# Patient Record
Sex: Female | Born: 1995 | Race: White | Hispanic: No | Marital: Married | Smoking: Never smoker
Health system: Southern US, Community
[De-identification: ages and names within clinical notes are randomized; demographics above are authoritative.]

---
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
## 2015-01-26 ENCOUNTER — Emergency Department: Admit: 2015-01-26 | Disposition: A | Discharge status: Home / Self Care | Payer: Self-pay | Admitting: Internal Medicine

## 2015-12-26 ENCOUNTER — Emergency Department: Payer: Self-pay

## 2015-12-26 ENCOUNTER — Emergency Department
Admission: EM | Admit: 2015-12-26 | Discharge: 2015-12-26 | Disposition: A | Discharge status: Home / Self Care | Payer: Self-pay | Attending: Emergency Medicine | Admitting: Emergency Medicine

## 2015-12-26 ENCOUNTER — Encounter: Payer: Self-pay | Admitting: Emergency Medicine

## 2015-12-26 DIAGNOSIS — W228XXA Striking against or struck by other objects, initial encounter: Secondary | ICD-10-CM | POA: Insufficient documentation

## 2015-12-26 DIAGNOSIS — S8011XA Contusion of right lower leg, initial encounter: Secondary | ICD-10-CM | POA: Insufficient documentation

## 2015-12-26 DIAGNOSIS — Y998 Other external cause status: Secondary | ICD-10-CM | POA: Insufficient documentation

## 2015-12-26 DIAGNOSIS — Y9289 Other specified places as the place of occurrence of the external cause: Secondary | ICD-10-CM | POA: Insufficient documentation

## 2015-12-26 DIAGNOSIS — Y9389 Activity, other specified: Secondary | ICD-10-CM | POA: Insufficient documentation

## 2015-12-26 MED ORDER — MELOXICAM 15 MG PO TABS: 15.0000 mg | ORAL_TABLET | Freq: Every day | ORAL | Status: AC

## 2015-12-26 NOTE — Discharge Instructions (Signed)
Periosteal Hematoma °Periosteal hematoma (bone bruise) is a localized, tender, raised area close to the bone. It can occur from a small hidden fracture of the bone, following surgery, or from other trauma to the area. It typically occurs in bones located close to the surface of the skin, such as the shin, knee, and heel bone. Although it may take 2 or more weeks to completely heal, bone bruises typically are not associated with permanent or serious damage to the bone. If you are taking blood thinners, you may be at greater risk for such injuries.  °CAUSES  °A bone bruise is usually caused by high-impact trauma to the bone, but it can be caused by sports injuries or twisting injuries. °SIGNS AND SYMPTOMS  °· Severe pain around the injured area that typically lasts longer than a normal bruise. °· Difficulty using the bruised area. °· Tender, raised area close to the bone. °· Discoloration or swelling of the bruised area. °DIAGNOSIS  °You may need an MRI of the injured area to confirm a bone bruise if your health care provider feels it is necessary. A regular X-ray will not detect a bone bruise, but it will detect a broken bone (fracture). An X-ray may be taken to rule out any fractures. °TREATMENT  °Often, the best treatment for a bone bruise is resting, icing, and applying cold compresses to the injured area. Over-the-counter medicines may also be recommended for pain control. °HOME CARE INSTRUCTIONS  °Some things you can do to improve the condition are:  °· Rest and elevate the area of injury as long as it is very tender or swollen. °· Apply ice to the injured area: °· Put ice in a plastic bag. °· Place a towel between your skin and the bag. °· Leave the ice on for 20 minutes, 2-3 times a day. °· Use an elastic wrap to reduce swelling and protect the injured area. Make sure it is not applied too tightly. If the area around the wrap becomes cold or blue, the wrap is too tight. Wrap it more loosely. °· For  activity: °· Follow your health care provider's instructions about whether walking with crutches is required. This will depend on how serious your condition is. °· Start weight bearing gradually on the bruised part. °· Continue to use crutches or a cane until you can stand without causing pain, or as instructed. °· If a plaster splint was applied: °· Wear the splint until you are seen for a follow-up exam. °· Rest it on nothing harder than a pillow the first 24 hours. °· Do not put weight on it. °· Do not get it wet. You may take it off to take a shower or bath. °· You may have been given an elastic bandage to use with or without the plaster splint. The splint is too tight if you have numbness or tingling, or if the skin around the bandage becomes cold and blue. Adjust the bandage to make it comfortable. °· If an air splint was applied: °· You may alter the amount of air in the splint as needed for comfort. °· You may take it off at night and to take a shower or bath. °· If the injury was in either leg, wiggle your toes in the splint several times per day if you are able. °· Only take over-the-counter or prescription medicines for pain, discomfort, or fever as directed by your health care provider. °· Keep all follow-up visits with your health care provider. This includes   any orthopedic referrals, physical therapy, and rehabilitation. Any delay in getting necessary care could result in a delay or failure of the bones to heal. °SEEK MEDICAL CARE IF:  °· You have an increase in bruising, swelling, tenderness, heat, or pain over your injury. °· You notice coldness of your toes that does not improve after removing a splint or bandage. °· Your pain is not lessened after you take medicine. °· You have increased difficulty bearing weight on the injured leg, if the injury is in either leg. °SEEK IMMEDIATE MEDICAL CARE IF:  °· You have severe pain near the injured area or severe pain with stretching. °· You have increased  swelling that resulted in a tense, hard area or a loss of sensation in the area of the injury. °· You have pale, cool skin below the area of the injury (in an extremity) that does not go away after removing a splint or bandage. °MAKE SURE YOU:  °· Understand these instructions. °· Will watch your condition. °· Will get help right away if you are not doing well or get worse. °  °This information is not intended to replace advice given to you by your health care provider. Make sure you discuss any questions you have with your health care provider. °  °Document Released: 11/19/2004 Document Revised: 08/02/2013 Document Reviewed: 03/31/2013 °Elsevier Interactive Patient Education ©2016 [employer] ° °Cryotherapy °Cryotherapy means treatment with cold. Ice or gel packs can be used to reduce both pain and swelling. Ice is the most helpful within the first 24 to 48 hours after an injury or flare-up from overusing a muscle or joint. Sprains, strains, spasms, burning pain, shooting pain, and aches can all be eased with ice. Ice can also be used when recovering from surgery. Ice is effective, has very few side effects, and is safe for most people to use. °PRECAUTIONS  °Ice is not a safe treatment option for people with: °· Raynaud phenomenon. This is a condition affecting small blood vessels in the extremities. Exposure to cold may cause your problems to return. °· Cold hypersensitivity. There are many forms of cold hypersensitivity, including: °¨ Cold urticaria. Red, itchy hives appear on the skin when the tissues begin to warm after being iced. °¨ Cold erythema. This is a red, itchy rash caused by exposure to cold. °¨ Cold hemoglobinuria. Red blood cells break down when the tissues begin to warm after being iced. The hemoglobin that carry oxygen are passed into the urine because they cannot combine with blood proteins fast enough. °· Numbness or altered sensitivity in the area being iced. °If you have any of the following  conditions, do not use ice until you have discussed cryotherapy with your caregiver: °· Heart conditions, such as arrhythmia, angina, or chronic heart disease. °· High blood pressure. °· Healing wounds or open skin in the area being iced. °· Current infections. °· Rheumatoid arthritis. °· Poor circulation. °· Diabetes. °Ice slows the blood flow in the region it is applied. This is beneficial when trying to stop inflamed tissues from spreading irritating chemicals to surrounding tissues. However, if you expose your skin to cold temperatures for too long or without the proper protection, you can damage your skin or nerves. Watch for signs of skin damage due to cold. °HOME CARE INSTRUCTIONS °Follow these tips to use ice and cold packs safely. °· Place a dry or damp towel between the ice and skin. A damp towel will cool the skin more quickly, so you may   need to shorten the time that the ice is used. °· For a more rapid response, add gentle compression to the ice. °· Ice for no more than 10 to 20 minutes at a time. The bonier the area you are icing, the less time it will take to get the benefits of ice. °· Check your skin after 5 minutes to make sure there are no signs of a poor response to cold or skin damage. °· Rest 20 minutes or more between uses. °· Once your skin is numb, you can end your treatment. You can test numbness by very lightly touching your skin. The touch should be so light that you do not see the skin dimple from the pressure of your fingertip. When using ice, most people will feel these normal sensations in this order: cold, burning, aching, and numbness. °· Do not use ice on someone who cannot communicate their responses to pain, such as small children or people with dementia. °HOW TO MAKE AN ICE PACK °Ice packs are the most common way to use ice therapy. Other methods include ice massage, ice baths, and cryosprays. Muscle creams that cause a cold, tingly feeling do not offer the same benefits that  ice offers and should not be used as a substitute unless recommended by your caregiver. °To make an ice pack, do one of the following: °· Place crushed ice or a bag of frozen vegetables in a sealable plastic bag. Squeeze out the excess air. Place this bag inside another plastic bag. Slide the bag into a pillowcase or place a damp towel between your skin and the bag. °· Mix 3 parts water with 1 part rubbing alcohol. Freeze the mixture in a sealable plastic bag. When you remove the mixture from the freezer, it will be slushy. Squeeze out the excess air. Place this bag inside another plastic bag. Slide the bag into a pillowcase or place a damp towel between your skin and the bag. °SEEK MEDICAL CARE IF: °· You develop white spots on your skin. This may give the skin a blotchy (mottled) appearance. °· Your skin turns blue or pale. °· Your skin becomes waxy or hard. °· Your swelling gets worse. °MAKE SURE YOU:  °· Understand these instructions. °· Will watch your condition. °· Will get help right away if you are not doing well or get worse. °  °This information is not intended to replace advice given to you by your health care provider. Make sure you discuss any questions you have with your health care provider. °  °Document Released: 06/08/2011 Document Revised: 11/02/2014 Document Reviewed: 06/08/2011 °Elsevier Interactive Patient Education ©2016 [employer] ° °

## 2015-12-26 NOTE — ED Notes (Signed)
Pt a/o. Right lower leg with bruising that occurred Sunday nite. + pulses. Sensation intact. Full rom.

## 2015-12-26 NOTE — ED Notes (Signed)
Pt was playing a night game, ran into a muffler sticking out of the back of a car with right leg, pain in ankle and lower shin. Walked to triage.

## 2015-12-26 NOTE — ED Provider Notes (Signed)
[facility] Emergency Department Provider Note  ____________________________________________  Time seen: Approximately 7:07 PM  I have reviewed the triage vital signs and the nursing notes.   HISTORY  Chief Complaint Leg Pain and Ankle Pain    HPI ONIYAH ROHE is a 20 y.o. female who presents to emergency department complaining of lower right shin pain. Patient states that 4 days prior she accidentally kicked the tongue of a trailer. She has had ecchymosis and swelling to the distal tibia. She states that she is able to walk and then the pain is minimal however the swelling and ecchymosis has continued. Patient denies any numbness or tingling in the distal foot. Patient denies any pain in the ankle or foot.   History reviewed. No pertinent past medical history.  There are no active problems to display for this patient.   History reviewed. No pertinent past surgical history.  Current Outpatient Rx  Name  Route  Sig  Dispense  Refill  . meloxicam (MOBIC) 15 MG tablet   Oral   Take 1 tablet (15 mg total) by mouth daily.   30 tablet   0     Allergies Review of patient's allergies indicates no known allergies.  No family history on file.  Social History Social History  Substance Use Topics  . Smoking status: Never Smoker   . Smokeless tobacco: None  . Alcohol Use: No     Review of Systems  Constitutional: No fever/chills Musculoskeletal: Negative for back pain. Positive for right lower leg pain. Skin: Negative for rash. Neurological: Negative for headaches, focal weakness or numbness. 10-point ROS otherwise negative.  ____________________________________________   PHYSICAL EXAM:  VITAL SIGNS: ED Triage Vitals  Enc Vitals Group     BP 12/26/15 1755 141/57 mmHg     Pulse Rate 12/26/15 1755 77     Resp 12/26/15 1755 18     Temp 12/26/15 1755 97.6 F (36.4 C)     Temp Source 12/26/15 1755 Oral     SpO2 12/26/15 1755 100 %      Weight --      Height --      Head Cir --      Peak Flow --      Pain Score 12/26/15 1752 1     Pain Loc --      Pain Edu? --      Excl. in GC? --      Constitutional: Alert and oriented. Well appearing and in no acute distress. Eyes: Conjunctivae are normal. PERRL. EOMI. Head: Atraumatic. Cardiovascular: Normal rate, regular rhythm. Normal S1 and S2.  Good peripheral circulation. Respiratory: Normal respiratory effort without tachypnea or retractions. Lungs CTAB. Musculoskeletal: Edema and ecchymosis noted to the distal right tibia. Patient is diffusely tender to palpation over the distal tibia. No point tenderness. No palpable abnormality. Full range of motion to ankle. Patient is nontender to palpation over either malleolus. Dorsalis pedis pulses appreciated. Sensation intact and equal to unaffected extremity. Neurologic:  Normal speech and language. No gross focal neurologic deficits are appreciated.  Skin:  Skin is warm, dry and intact. No rash noted. Psychiatric: Mood and affect are normal. Speech and behavior are normal. Patient exhibits appropriate insight and judgement.   ____________________________________________   LABS (all labs ordered are listed, but only abnormal results are displayed)  Labs Reviewed - No data to display ____________________________________________  EKG   ____________________________________________  RADIOLOGY Festus Barren Madylyn Insco, personally viewed and evaluated these images (plain radiographs)  as part of my medical decision making, as well as reviewing the written report by the radiologist.  Dg Tibia/fibula Right  12/26/2015  CLINICAL DATA:  Ankle pain for 4 days following running in to car, initial encounter EXAM: RIGHT TIBIA AND FIBULA - 2 VIEW COMPARISON:  None. FINDINGS: There is no evidence of fracture or other focal bone lesions. Soft tissues are unremarkable. IMPRESSION: No acute abnormality noted. Electronically Signed   By:  Alcide Clever M.D.   On: 12/26/2015 19:43    ____________________________________________    PROCEDURES  Procedure(s) performed:       Medications - No data to display   ____________________________________________   INITIAL IMPRESSION / ASSESSMENT AND PLAN / ED COURSE  Pertinent labs & imaging results that were available during my care of the patient were reviewed by me and considered in my medical decision making (see chart for details).  Patient's diagnosis is consistent with tibial contusion. X-ray reveals no acute osseous abnormality. Patient states that she is able to ambulate without assistance and declined use of crutches at this time.. Patient will be discharged home with prescriptions for anti-inflammatories for symptom control. Patient is to follow up with orthopedics if symptoms persist past this treatment course. Patient is given ED precautions to return to the ED for any worsening or new symptoms.     ____________________________________________  FINAL CLINICAL IMPRESSION(S) / ED DIAGNOSES  Final diagnoses:  Contusion of leg, right, initial encounter      NEW MEDICATIONS STARTED DURING THIS VISIT:  New Prescriptions   MELOXICAM (MOBIC) 15 MG TABLET    Take 1 tablet (15 mg total) by mouth daily.        Delorise Royals Laterrian Hevener, PA-C 12/26/15 2038  Loleta Rose, MD 12/27/15 [phone]

## 2016-03-28 IMAGING — CR DG TIBIA/FIBULA 2V*R*
1 series · 2 of 2 positions shown · non-contrast
Comparison: None.

CLINICAL DATA: Ankle pain for 4 days following running in to car,
initial encounter

EXAM:
RIGHT TIBIA AND FIBULA - 2 VIEW

[Series 1: x tib-fib ap right · 0.14mm/px · 2 of 2 slices shown]
[im 1/2]
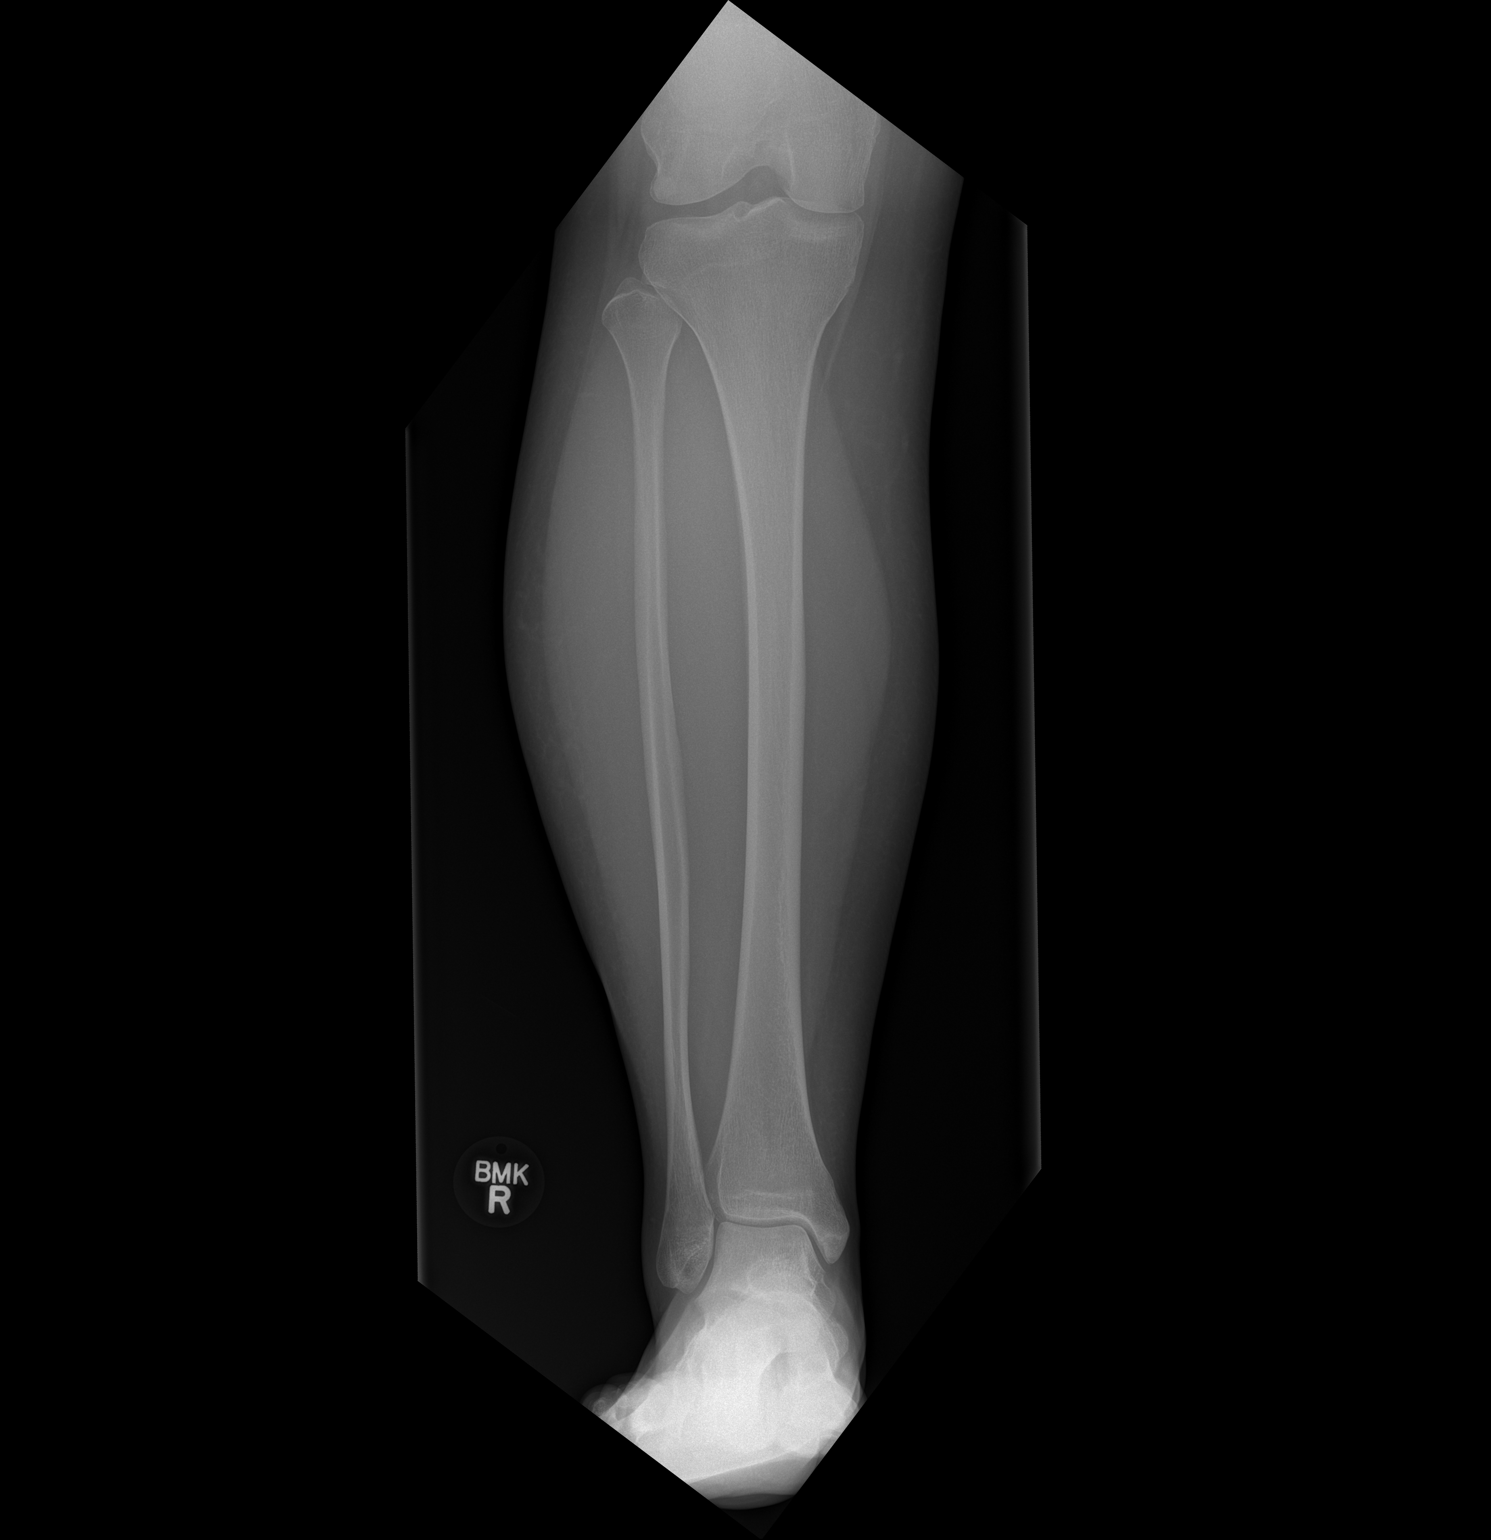
[im 2/2]
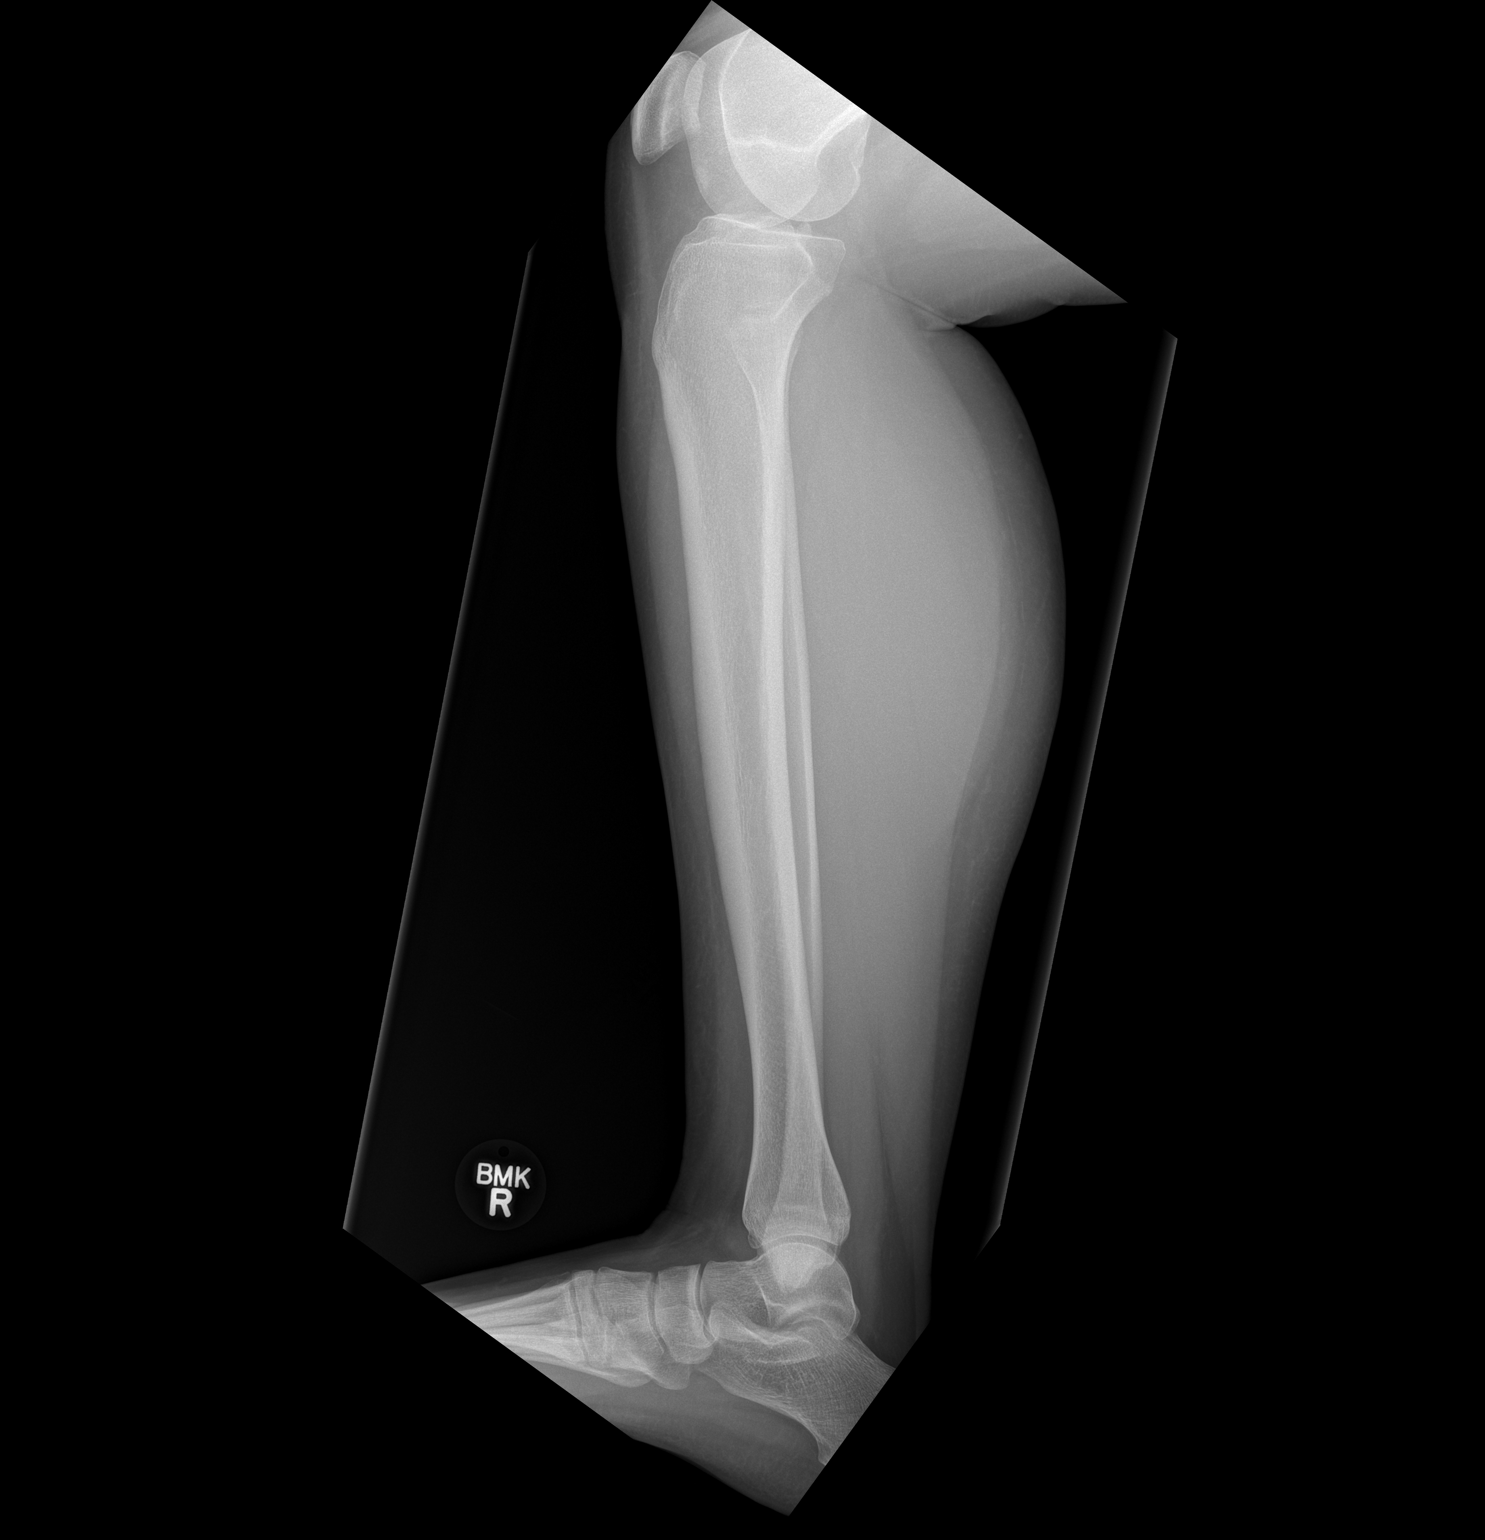

[2 of 2 positions shown; findings below may reference images not displayed]

FINDINGS: There is no evidence of fracture or other focal bone lesions. Soft
tissues are unremarkable.
IMPRESSION: No acute abnormality noted.

## 2018-06-08 ENCOUNTER — Encounter: Payer: Self-pay | Admitting: Intensive Care

## 2018-06-08 ENCOUNTER — Other Ambulatory Visit: Payer: Self-pay

## 2018-06-08 ENCOUNTER — Emergency Department: Payer: BC Managed Care – PPO

## 2018-06-08 ENCOUNTER — Emergency Department
Admission: EM | Admit: 2018-06-08 | Discharge: 2018-06-08 | Disposition: A | Discharge status: Home / Self Care | Payer: BC Managed Care – PPO | Attending: Emergency Medicine | Admitting: Emergency Medicine

## 2018-06-08 DIAGNOSIS — R109 Unspecified abdominal pain: Secondary | ICD-10-CM | POA: Diagnosis not present

## 2018-06-08 DIAGNOSIS — O209 Hemorrhage in early pregnancy, unspecified: Secondary | ICD-10-CM | POA: Diagnosis not present

## 2018-06-08 DIAGNOSIS — Z3201 Encounter for pregnancy test, result positive: Secondary | ICD-10-CM | POA: Diagnosis not present

## 2018-06-08 DIAGNOSIS — O26899 Other specified pregnancy related conditions, unspecified trimester: Secondary | ICD-10-CM | POA: Diagnosis not present

## 2018-06-08 DIAGNOSIS — O4690 Antepartum hemorrhage, unspecified, unspecified trimester: Secondary | ICD-10-CM

## 2018-06-08 DIAGNOSIS — Z3A00 Weeks of gestation of pregnancy not specified: Secondary | ICD-10-CM | POA: Diagnosis not present

## 2018-06-08 LAB — CBC
HCT: 41.5 % (ref 35.0–47.0)
Hemoglobin: 14.2 g/dL (ref 12.0–16.0)
MCH: 31.6 pg (ref 26.0–34.0)
MCHC: 34.1 g/dL (ref 32.0–36.0)
MCV: 92.6 fL (ref 80.0–100.0)
PLATELETS: 261 10*3/uL (ref 150–440)
RBC: 4.48 MIL/uL (ref 3.80–5.20)
RDW: 13.8 % (ref 11.5–14.5)
WBC: 9.7 10*3/uL (ref 3.6–11.0)

## 2018-06-08 LAB — ABO/RH: ABO/RH(D): A POS

## 2018-06-08 LAB — POCT PREGNANCY, URINE: PREG TEST UR: POSITIVE — AB

## 2018-06-08 LAB — HCG, QUANTITATIVE, PREGNANCY: HCG, BETA CHAIN, QUANT, S: 465 m[IU]/mL — AB (ref <5)

## 2018-06-08 NOTE — Discharge Instructions (Addendum)
As I explained to you, the ultrasound is concerning for possible pregnancy located in the left ovary.  This can make you very sick and it is very important they follow-up with OB/GYN on Friday for repeat pregnancy hormones.  If you develop worsening abdominal pain, if you feel dizzy like you are going to pass out, or if you have heavy vaginal bleeding please return to the emergency room.

## 2018-06-08 NOTE — ED Notes (Signed)
No other orders at this time per Roxan Hockeyobinson

## 2018-06-08 NOTE — ED Triage Notes (Addendum)
Patient reports vaginal bleeding since last Monday 05/30/18. Recently had positive pregnancy test beginning of August. Last menstrual cycle June 26th. Intermittent abdominal cramping

## 2018-06-08 NOTE — ED Notes (Signed)
FIRST NURSE NOTE:  Pt briefly told registration she has been having vaginal bleeding for several days and is having a miscarriage.

## 2018-06-08 NOTE — ED Provider Notes (Signed)
[facility] Emergency Department Provider Note  ____________________________________________  Time seen: Approximately 5:43 PM  I have reviewed the triage vital signs and the nursing notes.   HISTORY  Chief Complaint Vaginal Bleeding   HPI Audrey Lee is a 22 y.o. female with no significant past medical history who presents for evaluation of abdominal pain vaginal bleeding.  Patient reports last menstrual period was June 26.  On August 2 she took a pregnancy test which was positive.  3 days later she started having spotting intermittently.  2 days ago the bleeding got a little bit more constant but still mild.  Patient reports that yesterday evening she passed a clear sac into the toilet which she believes was her pregnancy sac.  Today she started having mild intermittent suprapubic abdominal cramping.  Her pain is 2 out of 10 at this time.  She is still having mild vaginal bleeding.  No dizziness.  This was her first pregnancy.   Prior to Admission medications   Medication Sig Start Date End Date Taking? Authorizing Provider  meloxicam (MOBIC) 15 MG tablet Take 1 tablet (15 mg total) by mouth daily. 12/26/15   Cuthriell, Delorise Royals, PA-C   PMH None - reviewed  Allergies Patient has no known allergies.  History reviewed. No pertinent family history.  Social History Social History   Tobacco Use  . Smoking status: Never Smoker  . Smokeless tobacco: Never Used  Substance Use Topics  . Alcohol use: No  . Drug use: Never    Review of Systems  Constitutional: Negative for fever. Eyes: Negative for visual changes. ENT: Negative for sore throat. Neck: No neck pain  Cardiovascular: Negative for chest pain. Respiratory: Negative for shortness of breath. Gastrointestinal: + suprapubic abdominal pain. No vomiting or diarrhea. Genitourinary: Negative for dysuria. + vaginal bleeding Musculoskeletal: Negative for back pain. Skin: Negative for  rash. Neurological: Negative for headaches, weakness or numbness. Psych: No SI or HI  ____________________________________________   PHYSICAL EXAM:  VITAL SIGNS: ED Triage Vitals [06/08/18 1635]  Enc Vitals Group     BP (!) 148/69     Pulse Rate (!) 114     Resp 18     Temp 98.6 F (37 C)     Temp Source Oral     SpO2 99 %     Weight 204 lb (92.5 kg)     Height 5\' 5"  (1.651 m)     Head Circumference      Peak Flow      Pain Score 0     Pain Loc      Pain Edu?      Excl. in GC?     Constitutional: Alert and oriented. Well appearing and in no apparent distress. HEENT:      Head: Normocephalic and atraumatic.         Eyes: Conjunctivae are normal. Sclera is non-icteric.       Mouth/Throat: Mucous membranes are moist.       Neck: Supple with no signs of meningismus. Cardiovascular: Regular rate and rhythm. No murmurs, gallops, or rubs. 2+ symmetrical distal pulses are present in all extremities. No JVD. Respiratory: Normal respiratory effort. Lungs are clear to auscultation bilaterally. No wheezes, crackles, or rhonchi.  Gastrointestinal: Soft, non tender, and non distended with positive bowel sounds. No rebound or guarding. Genitourinary: No CVA tenderness. Musculoskeletal: Nontender with normal range of motion in all extremities. No edema, cyanosis, or erythema of extremities. Neurologic: Normal speech and language.  Face is symmetric. Moving all extremities. No gross focal neurologic deficits are appreciated. Skin: Skin is warm, dry and intact. No rash noted. Psychiatric: Mood and affect are normal. Speech and behavior are normal.  ____________________________________________   LABS (all labs ordered are listed, but only abnormal results are displayed)  Labs Reviewed  HCG, QUANTITATIVE, PREGNANCY - Abnormal; Notable for the following components:      Result Value   hCG, Beta Chain, Quant, S 465 (*)    All other components within normal limits  POCT PREGNANCY, URINE  - Abnormal; Notable for the following components:   Preg Test, Ur POSITIVE (*)    All other components within normal limits  CBC  POC URINE PREG, ED  ABO/RH   ____________________________________________  EKG  none  ____________________________________________  RADIOLOGY  I have personally reviewed the images performed during this visit and I agree with the Radiologist's read.   Interpretation by Radiologist:  [place] Ob Comp Less 14 Wks  Result Date: 06/08/2018 CLINICAL DATA:  Abdominal pain. EXAM: OBSTETRIC <14 WK US AND TRANSVAGINAL OB US TECHNIQUE: Both transabdominal and transvaginal ultrasound examinations were performed for complete evaluation of the gestation as well as the maternal uterus, adnexal regions, and pelvic cul-de-sac. Transvaginal technique was performed to assess early pregnancy. COMPARISON:  None. FINDINGS: Intrauterine gestational sac: None Yolk sac:  Not Visualized. Embryo:  Not Visualized. Subchorionic hemorrhage:  None visualized. Maternal uterus/adnexae: Subchorionic hemorrhage: None Right ovary: Normal Left ovary: Normal containing a corpus luteal cyst. Other :There is a mass within the left adnexa separate from the left ovary containing an internal cystic structure which may represent an ectopic pregnancy. This measures 0.6 x 0.5 x 0.8 cm. Free fluid:  No free fluid IMPRESSION: 1. No evidence for intrauterine gestation. No intrauterine gestational sac, yolk sac, or fetal pole identified. Differential considerations include intrauterine pregnancy too early to be sonographically visualized, missed abortion, or ectopic pregnancy. Followup ultrasound is recommended in 10-14 days for further evaluation. 2. Suspicious mass within the left adnexa adjacent to the left ovary. Cannot rule out ectopic pregnancy. Electronically Signed   By: Signa Kellaylor  Stroud M.D.   On: 06/08/2018 18:24   [place] Ob Transvaginal  Result Date: 06/08/2018 CLINICAL DATA:  Abdominal pain. EXAM: OBSTETRIC <14  WK US AND TRANSVAGINAL OB US TECHNIQUE: Both transabdominal and transvaginal ultrasound examinations were performed for complete evaluation of the gestation as well as the maternal uterus, adnexal regions, and pelvic cul-de-sac. Transvaginal technique was performed to assess early pregnancy. COMPARISON:  None. FINDINGS: Intrauterine gestational sac: None Yolk sac:  Not Visualized. Embryo:  Not Visualized. Subchorionic hemorrhage:  None visualized. Maternal uterus/adnexae: Subchorionic hemorrhage: None Right ovary: Normal Left ovary: Normal containing a corpus luteal cyst. Other :There is a mass within the left adnexa separate from the left ovary containing an internal cystic structure which may represent an ectopic pregnancy. This measures 0.6 x 0.5 x 0.8 cm. Free fluid:  No free fluid IMPRESSION: 1. No evidence for intrauterine gestation. No intrauterine gestational sac, yolk sac, or fetal pole identified. Differential considerations include intrauterine pregnancy too early to be sonographically visualized, missed abortion, or ectopic pregnancy. Followup ultrasound is recommended in 10-14 days for further evaluation. 2. Suspicious mass within the left adnexa adjacent to the left ovary. Cannot rule out ectopic pregnancy. Electronically Signed   By: Signa Kellaylor  Stroud M.D.   On: 06/08/2018 18:24      ____________________________________________   PROCEDURES  Procedure(s) performed: None Procedures Critical Care performed:  None ____________________________________________  INITIAL IMPRESSION / ASSESSMENT AND PLAN / ED COURSE   22 y.o. female with no significant past medical history who presents for evaluation of abdominal pain and vaginal bleeding.  Pregnancy test is positive.  Patient is concerned that she passed a clear sac yesterday.  At this time her abdomen is soft with no tenderness throughout.  Vitals show slight tachycardia but patient looks very anxious.  Remaining of her vital signs are within  normal limits.  We will send patient for transvaginal ultrasound to rule out ectopic or retained POC.  Will check ABO to determine if patient needs RhoGam.  Will check CBC to rule out acute blood loss anemia.  Clinical Course as of Jun 09 2007  Wed Jun 08, 2018  2003 Beta quant of 465.  Ultrasound showing no intrauterine pregnancy.  Does show fluid collection in the left ovary therefore unable to rule out ectopic pregnancy.  Patient remains hemodynamically stable with no abdominal pain or tenderness.  Discussed with Dr. Leeroy Bockhelsea Ward who evaluated the ultrasound and recommended follow-up in 48 hours for repeat beta quant. ectopic pregnancy.  I spoke withDiscussed patient with Dr. Ranae Plumberhelsea Ward, ObGYN who looked at the US and recommended repeat hCG in 48 hrs. Patient instructed to return to the ER for worsening abdominal pain, syncope, or severe vaginal bleeding. Patient RH+ with no indication for Rhogam. Hgb stable at 14.2. Will dc home now.   [CV]    Clinical Course User Index [CV] Don PerkingVeronese, [place], MD     As part of my medical decision making, I reviewed the following data within the electronic MEDICAL RECORD NUMBER Nursing notes reviewed and incorporated, Labs reviewed  Old chart reviewed, Radiograph reviewed , A consult was requested and obtained from this/these consultant(s) ObGYN, Notes from prior ED visits and [place] Controlled Substance Database    Pertinent labs & imaging results that were available during my care of the patient were reviewed by me and considered in my medical decision making (see chart for details).    ____________________________________________   FINAL CLINICAL IMPRESSION(S) / ED DIAGNOSES  Final diagnoses:  Vaginal bleeding in pregnancy      NEW MEDICATIONS STARTED DURING THIS VISIT:  ED Discharge Orders    None       Note:  This document was prepared using Dragon voice recognition software and may include unintentional dictation errors.    Nita SickleVeronese,  [place], MD 06/08/18 2009

## 2018-06-08 NOTE — ED Notes (Signed)
Patient in US at this time

## 2019-07-28 IMAGING — US US OB TRANSVAGINAL
1 series · 13 of 28 positions shown · non-contrast
Comparison: None.

CLINICAL DATA: Abdominal pain.

EXAM:
OBSTETRIC <14 WK US AND TRANSVAGINAL OB US
TECHNIQUE: Both transabdominal and transvaginal ultrasound examinations were
performed for complete evaluation of the gestation as well as the
maternal uterus, adnexal regions, and pelvic cul-de-sac.
Transvaginal technique was performed to assess early pregnancy.

[Series 1: us ob transvaginal · 0.23mm/px · 13 of 98 slices shown]
[im 4/98]
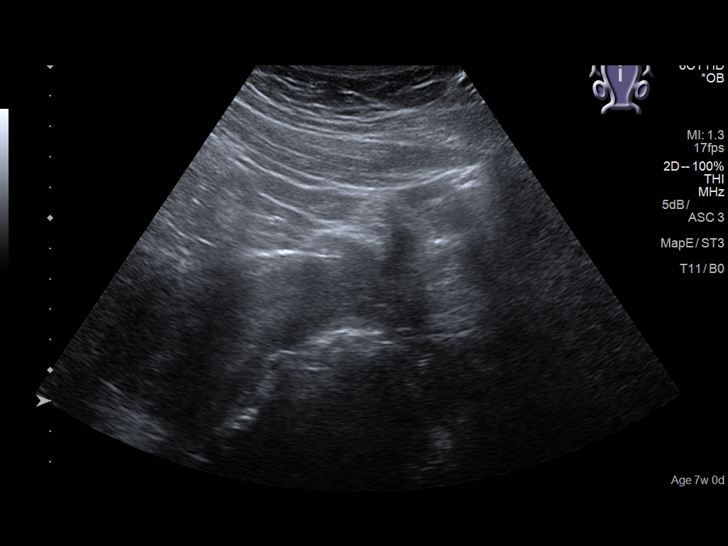
[im 11/98]
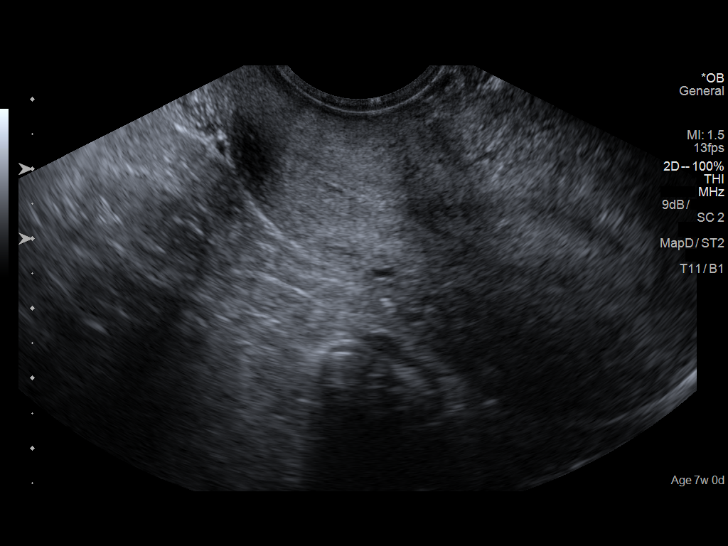
[im 18/98]
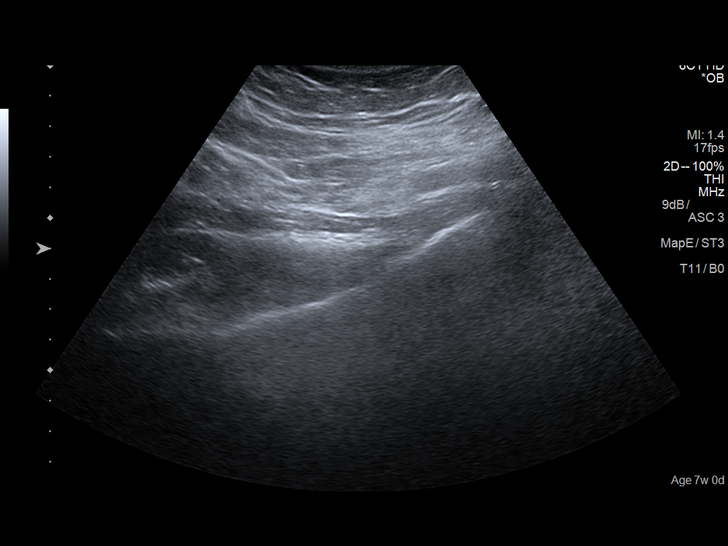
[im 26/98]
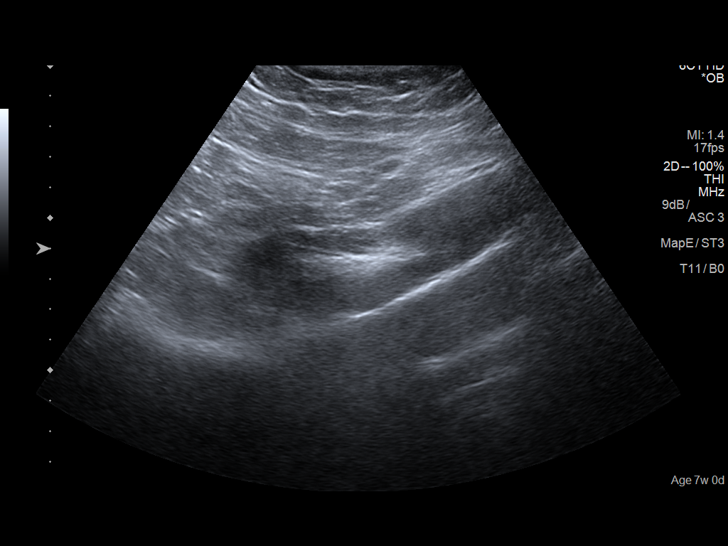
[im 33/98]
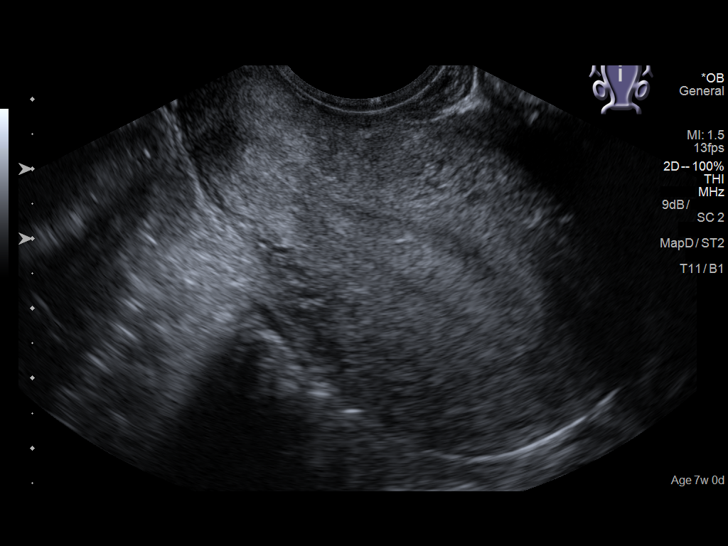
[im 40/98]
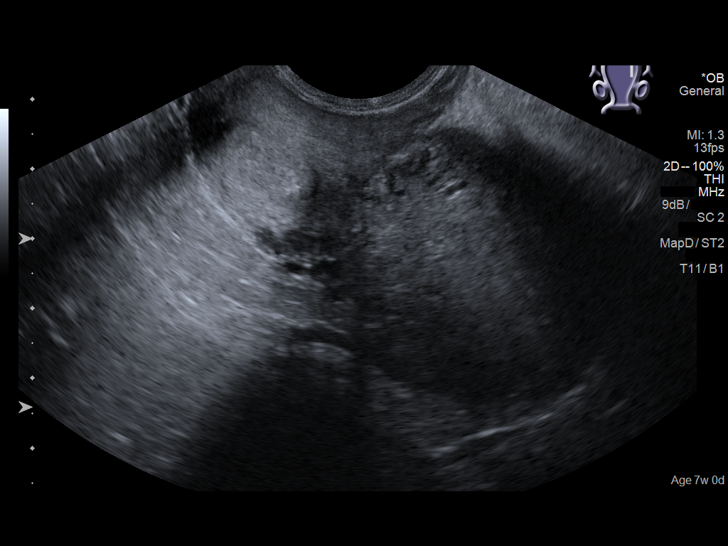
[im 51/98]
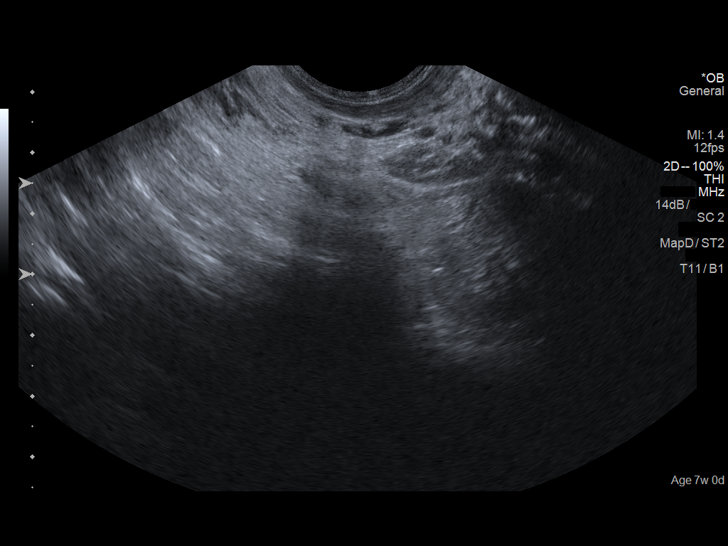
[im 58/98]
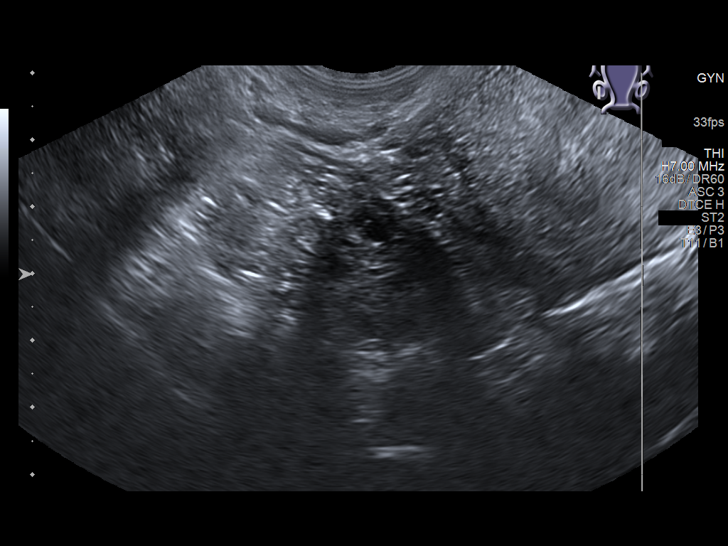
[im 65/98]
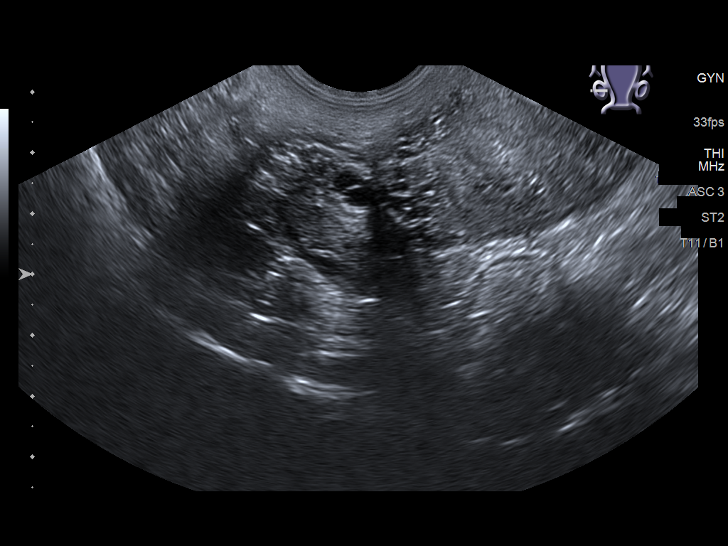
[im 72/98]
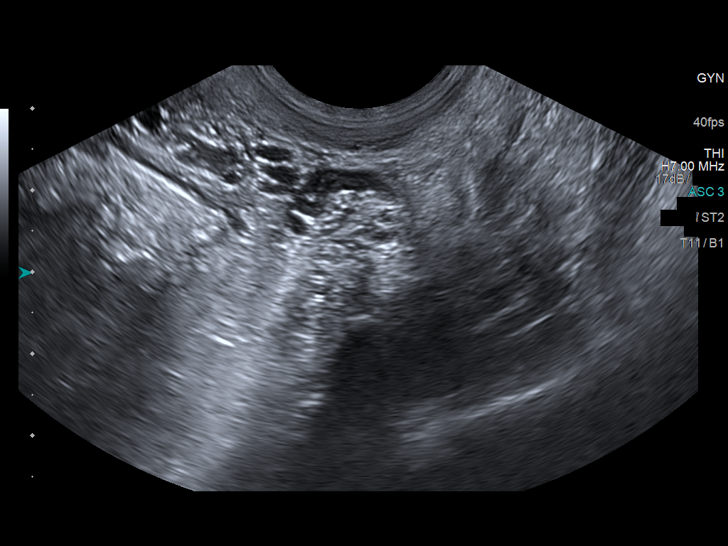
[im 80/98]
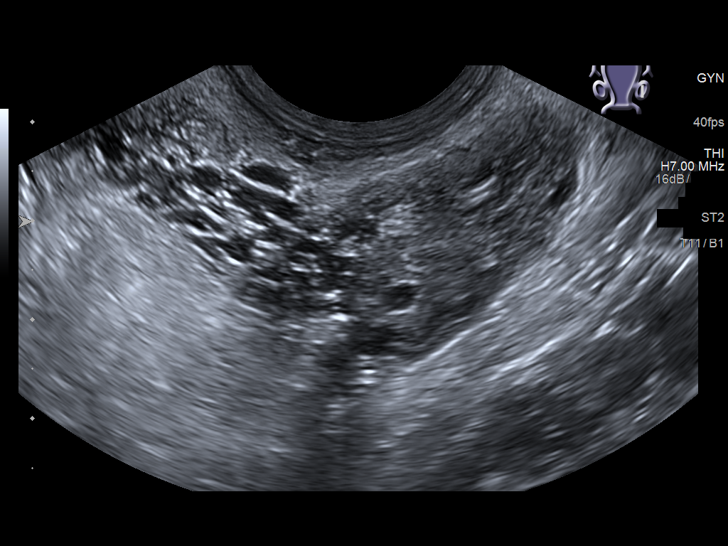
[im 87/98]
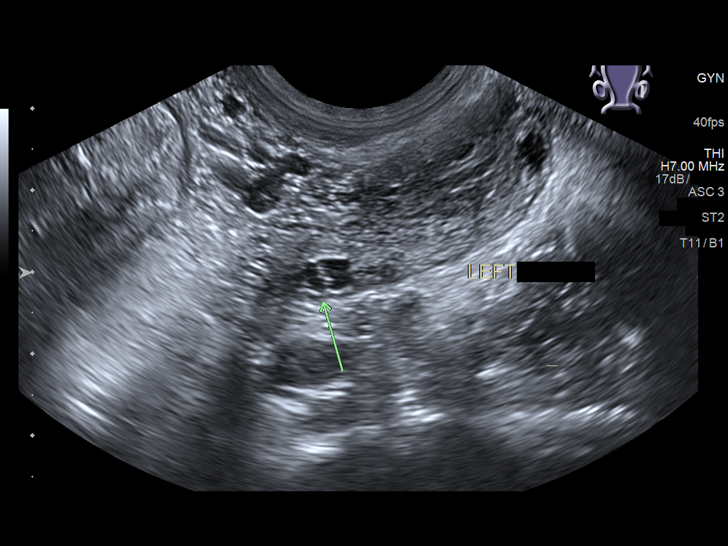
[im 94/98]
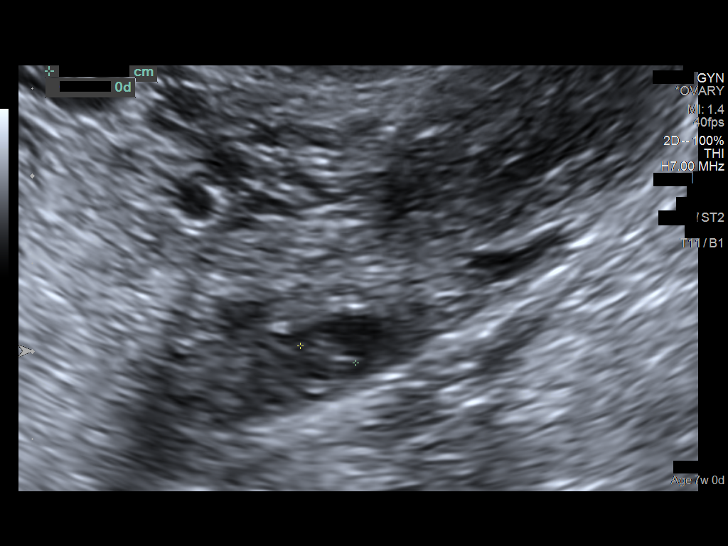

[13 of 28 positions shown; findings below may reference images not displayed]

FINDINGS: Intrauterine gestational sac: None

Yolk sac:  Not Visualized.

Embryo:  Not Visualized.

Subchorionic hemorrhage:  None visualized.

Maternal uterus/adnexae:

Subchorionic hemorrhage: None

Right ovary: Normal

Left ovary: Normal containing a corpus luteal cyst.

Other :There is a mass within the left adnexa separate from the left
ovary containing an internal cystic structure which may represent an
ectopic pregnancy. This measures 0.6 x 0.5 x 0.8 cm.

Free fluid:  No free fluid
IMPRESSION: 1. No evidence for intrauterine gestation. No intrauterine
gestational sac, yolk sac, or fetal pole identified. Differential
considerations include intrauterine pregnancy too early to be
sonographically visualized, missed abortion, or ectopic pregnancy.
Followup ultrasound is recommended in 10-14 days for further
evaluation.
2. Suspicious mass within the left adnexa adjacent to the left
ovary. Cannot rule out ectopic pregnancy.
